# Patient Record
Sex: Male | Born: 2000 | Race: White | Hispanic: No | Marital: Single | Smoking: Never smoker
Health system: Southern US, Community
[De-identification: ages and names within clinical notes are randomized; demographics above are authoritative.]

---

## 2019-07-18 ENCOUNTER — Emergency Department: Payer: Managed Care, Other (non HMO)

## 2019-07-18 ENCOUNTER — Emergency Department
Admission: EM | Admit: 2019-07-18 | Discharge: 2019-07-18 | Disposition: A | Discharge status: Home / Self Care | Payer: Managed Care, Other (non HMO) | Attending: Emergency Medicine | Admitting: Emergency Medicine

## 2019-07-18 ENCOUNTER — Other Ambulatory Visit: Payer: Self-pay

## 2019-07-18 ENCOUNTER — Encounter: Payer: Self-pay | Admitting: Emergency Medicine

## 2019-07-18 DIAGNOSIS — S93401A Sprain of unspecified ligament of right ankle, initial encounter: Secondary | ICD-10-CM | POA: Insufficient documentation

## 2019-07-18 DIAGNOSIS — Y999 Unspecified external cause status: Secondary | ICD-10-CM | POA: Insufficient documentation

## 2019-07-18 DIAGNOSIS — Y9289 Other specified places as the place of occurrence of the external cause: Secondary | ICD-10-CM | POA: Diagnosis not present

## 2019-07-18 DIAGNOSIS — Y9389 Activity, other specified: Secondary | ICD-10-CM | POA: Diagnosis not present

## 2019-07-18 DIAGNOSIS — S99911A Unspecified injury of right ankle, initial encounter: Secondary | ICD-10-CM | POA: Diagnosis present

## 2019-07-18 DIAGNOSIS — X509XXA Other and unspecified overexertion or strenuous movements or postures, initial encounter: Secondary | ICD-10-CM | POA: Diagnosis not present

## 2019-07-18 MED ORDER — IBUPROFEN 800 MG PO TABS
800.0000 mg | ORAL_TABLET | Freq: Three times a day (TID) | ORAL | 0 refills | Status: AC | PRN
Start: 2019-07-18 — End: ?

## 2019-07-18 MED ORDER — IBUPROFEN 800 MG PO TABS
800.0000 mg | ORAL_TABLET | Freq: Once | ORAL | Status: AC
  Administered 2019-07-18: 800 mg via ORAL
  Filled 2019-07-18: qty 1

## 2019-07-18 NOTE — ED Provider Notes (Signed)
[facility] Emergency Department Provider Note  ____________________________________________   First MD Initiated Contact with Patient 07/18/19 [phone]     (approximate)  I have reviewed the triage vital signs and the nursing notes.   HISTORY  Chief Complaint Ankle Pain    HPI Roverto Bodmer is a 19 y.o. male resents to the emergency department secondary to right ankle pain after "rolling his ankle while he was going down steps approximately 1 AM this morning.  Patient states that pain is mild at present.  Pain is worsened with movement of the ankle.        History reviewed. No pertinent past medical history.  There are no problems to display for this patient.   History reviewed. No pertinent surgical history.  Prior to Admission medications   Not on File    Allergies Patient has no known allergies.  No family history on file.  Social History Social History   Tobacco Use  . Smoking status: Never Smoker  . Smokeless tobacco: Never Used  Substance Use Topics  . Alcohol use: Not on file  . Drug use: Not on file    Review of Systems Constitutional: No fever/chills Eyes: No visual changes. ENT: No sore throat. Cardiovascular: Denies chest pain. Respiratory: Denies shortness of breath. Gastrointestinal: No abdominal pain.  No nausea, no vomiting.  No diarrhea.  No constipation. Genitourinary: Negative for dysuria. Musculoskeletal: Negative for neck pain.  Negative for back pain. Integumentary: Negative for rash. Neurological: Negative for headaches, focal weakness or numbness. Psychiatric:    ____________________________________________   PHYSICAL EXAM:  VITAL SIGNS: ED Triage Vitals  Enc Vitals Group     BP 07/18/19 0359 131/67     Pulse Rate 07/18/19 0359 84     Resp 07/18/19 0359 18     Temp 07/18/19 0359 98.2 F (36.8 C)     Temp Source 07/18/19 0359 Oral     SpO2 07/18/19 0359 97 %     Weight 07/18/19 0358 68 kg (150  lb)     Height 07/18/19 0358 1.753 m (5\' 9" )     Head Circumference --      Peak Flow --      Pain Score 07/18/19 0358 7     Pain Loc --      Pain Edu? --      Excl. in GC? --     Constitutional: Alert and oriented.  Eyes: Conjunctivae are normal.  Head: Atraumatic. Mouth/Throat: Patient is wearing a mask. Neck: No stridor.  .   Musculoskeletal: Lateral malleoli swelling and pain with gentle palpation.  Mild pain with active and passive range of motion Neurologic:  Normal speech and language. No gross focal neurologic deficits are appreciated.  Skin:  Skin is warm, dry and intact. Psychiatric: Mood and affect are normal. Speech and behavior are normal.  ____________________________________________   LABS (all labs ordered are listed, but only abnormal results are displayed)  Labs Reviewed - No data to display ____________________________________________  EKG   RADIOLOGY I, [place] N Pleasant Bensinger, personally viewed and evaluated these images (plain radiographs) as part of my medical decision making, as well as reviewing the written report by the radiologist.  ED MD interpretation:    Official radiology report(s): DG Ankle Complete Right  Result Date: 07/18/2019 CLINICAL DATA:  Posttraumatic right ankle pain EXAM: RIGHT ANKLE - COMPLETE 3+ VIEW COMPARISON:  None. FINDINGS: Soft tissue swelling and probable ankle joint effusion. No acute fracture or subluxation. IMPRESSION: Soft tissue  swelling and probable ankle joint effusion. No acute fracture or subluxation. Electronically Signed   By: Monte Fantasia M.D.   On: 07/18/2019 04:41    ____________________________________________   Procedures   ____________________________________________   INITIAL IMPRESSION / MDM / [place]fr[place] / ED COURSE  As part of my medical decision making, I reviewed the following data within the electronic MEDICAL RECORD NUMBER  19 year old male presented with above-stated history and physical  exam secondary to right ankle injury with pressure diagnosis including but not limited to sprain ligamentous tear, fracture.  X-ray revealed no acute fracture or subluxation however did reveal soft tissue swelling and probably joint effusion.  Boot was applied to the patient crutches were given.  Patient given ibuprofen 800 mg.  Spoke with the patient at length regarding sprain management at home.     ____________________________________________  FINAL CLINICAL IMPRESSION(S) / ED DIAGNOSES  Final diagnoses:  None     MEDICATIONS GIVEN DURING THIS VISIT:  Medications - No data to display   ED Discharge Orders    None      *Please note:  Johnpatrick Jenny was evaluated in Emergency Department on 07/18/2019 for the symptoms described in the history of present illness. He was evaluated in the context of the global COVID-19 pandemic, which necessitated consideration that the patient might be at risk for infection with the SARS-CoV-2 virus that causes COVID-19. Institutional protocols and algorithms that pertain to the evaluation of patients at risk for COVID-19 are in a state of rapid change based on information released by regulatory bodies including the CDC and federal and state organizations. These policies and algorithms were followed during the patient's care in the ED.  Some ED evaluations and interventions may be delayed as a result of limited staffing during the pandemic.*  Note:  This document was prepared using Dragon voice recognition software and may include unintentional dictation errors.   Gregor Hams, MD 07/18/19 [phone]

## 2019-07-18 NOTE — ED Triage Notes (Signed)
Pt to triage via w/c with no distress noted, mask in place; pt reports "rolling" right ankle going down steps several hrs ago

## 2021-12-20 IMAGING — CR DG ANKLE COMPLETE 3+V*R*
1 series · 3 of 3 positions shown · non-contrast
Comparison: None.

CLINICAL DATA: Posttraumatic right ankle pain

EXAM:
RIGHT ANKLE - COMPLETE 3+ VIEW

[Series 1: x ankle ap right · 0.14mm/px · 3 of 3 slices shown]
[im 1/3]
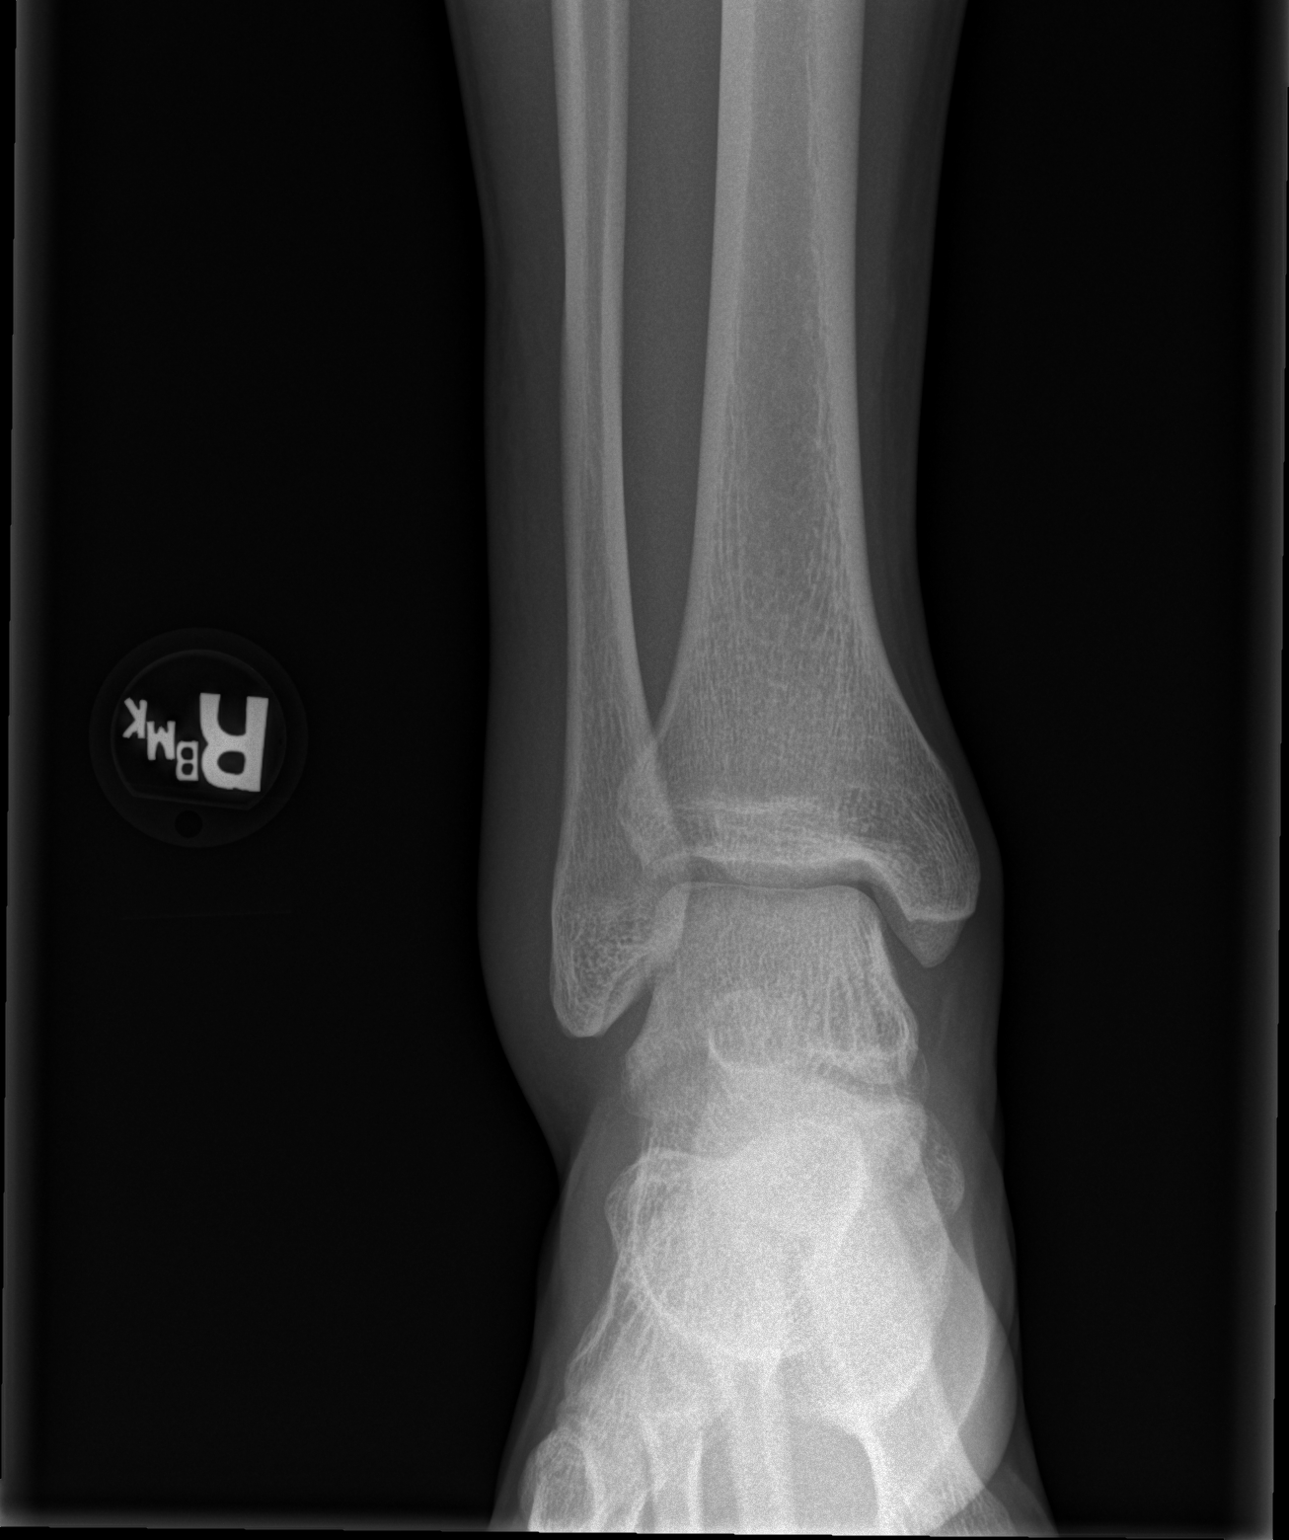
[im 2/3]
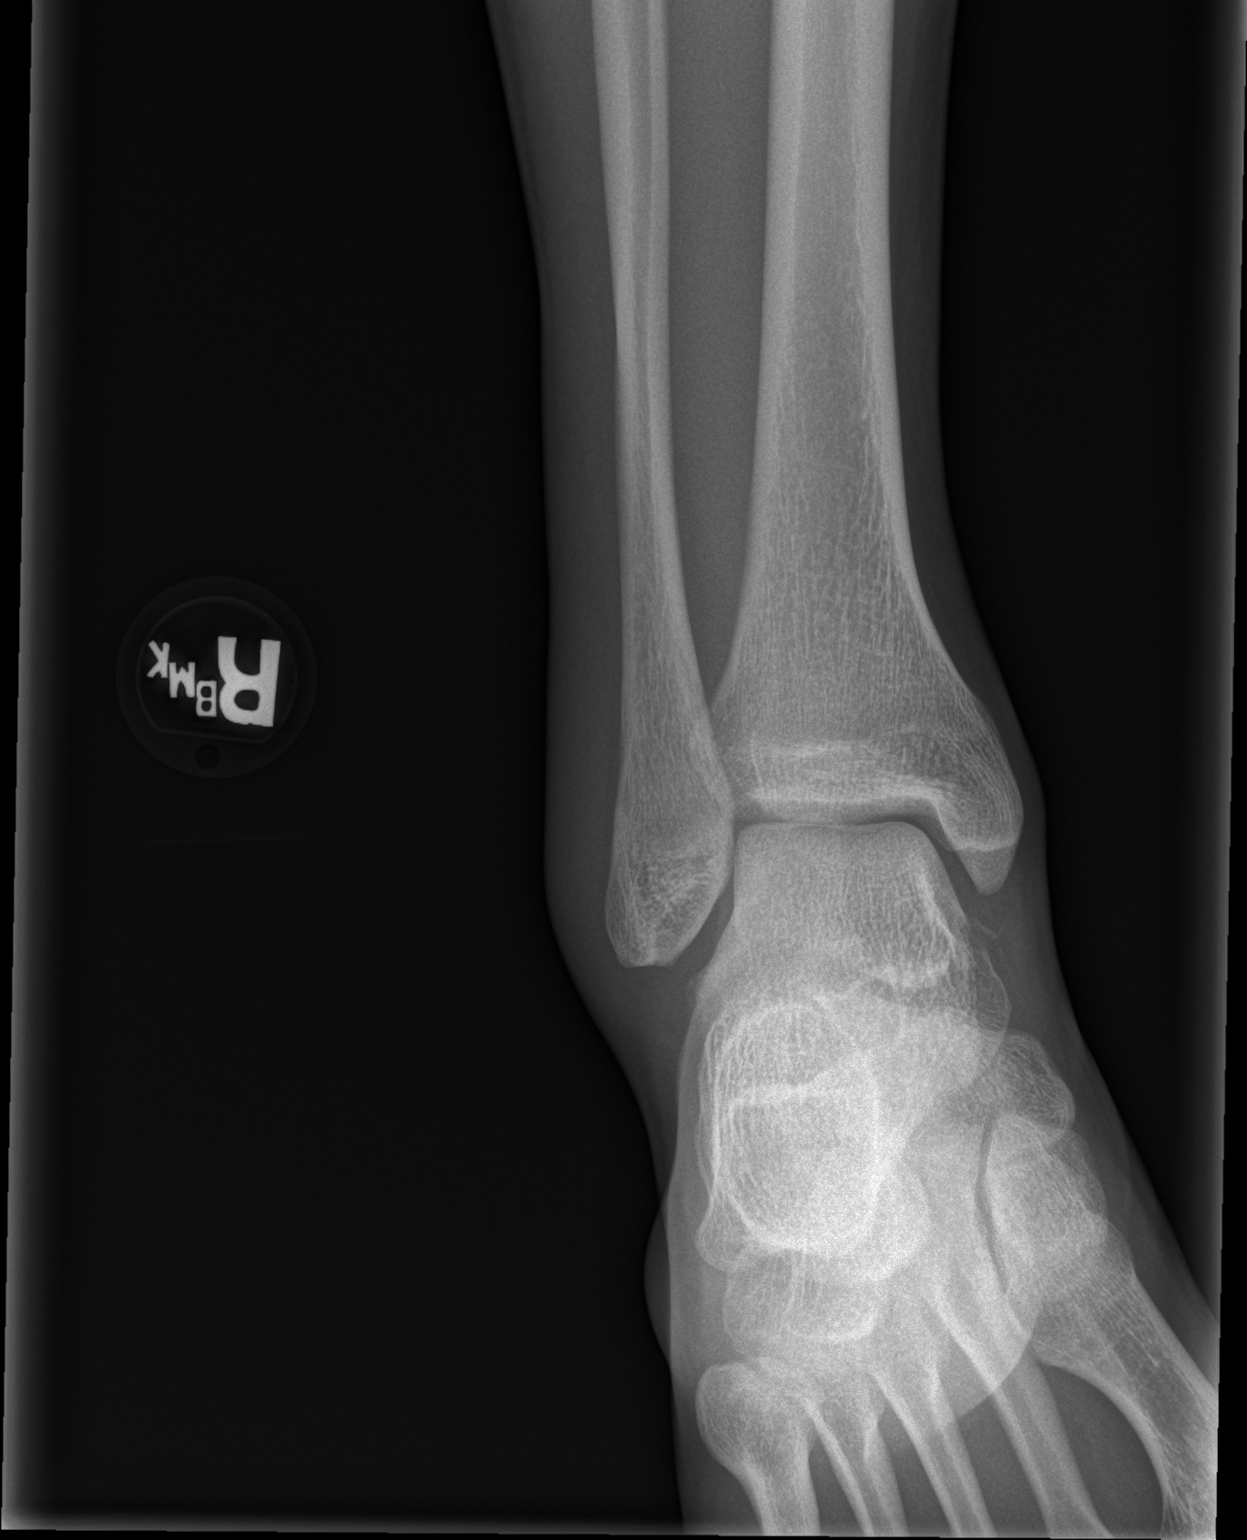
[im 3/3]
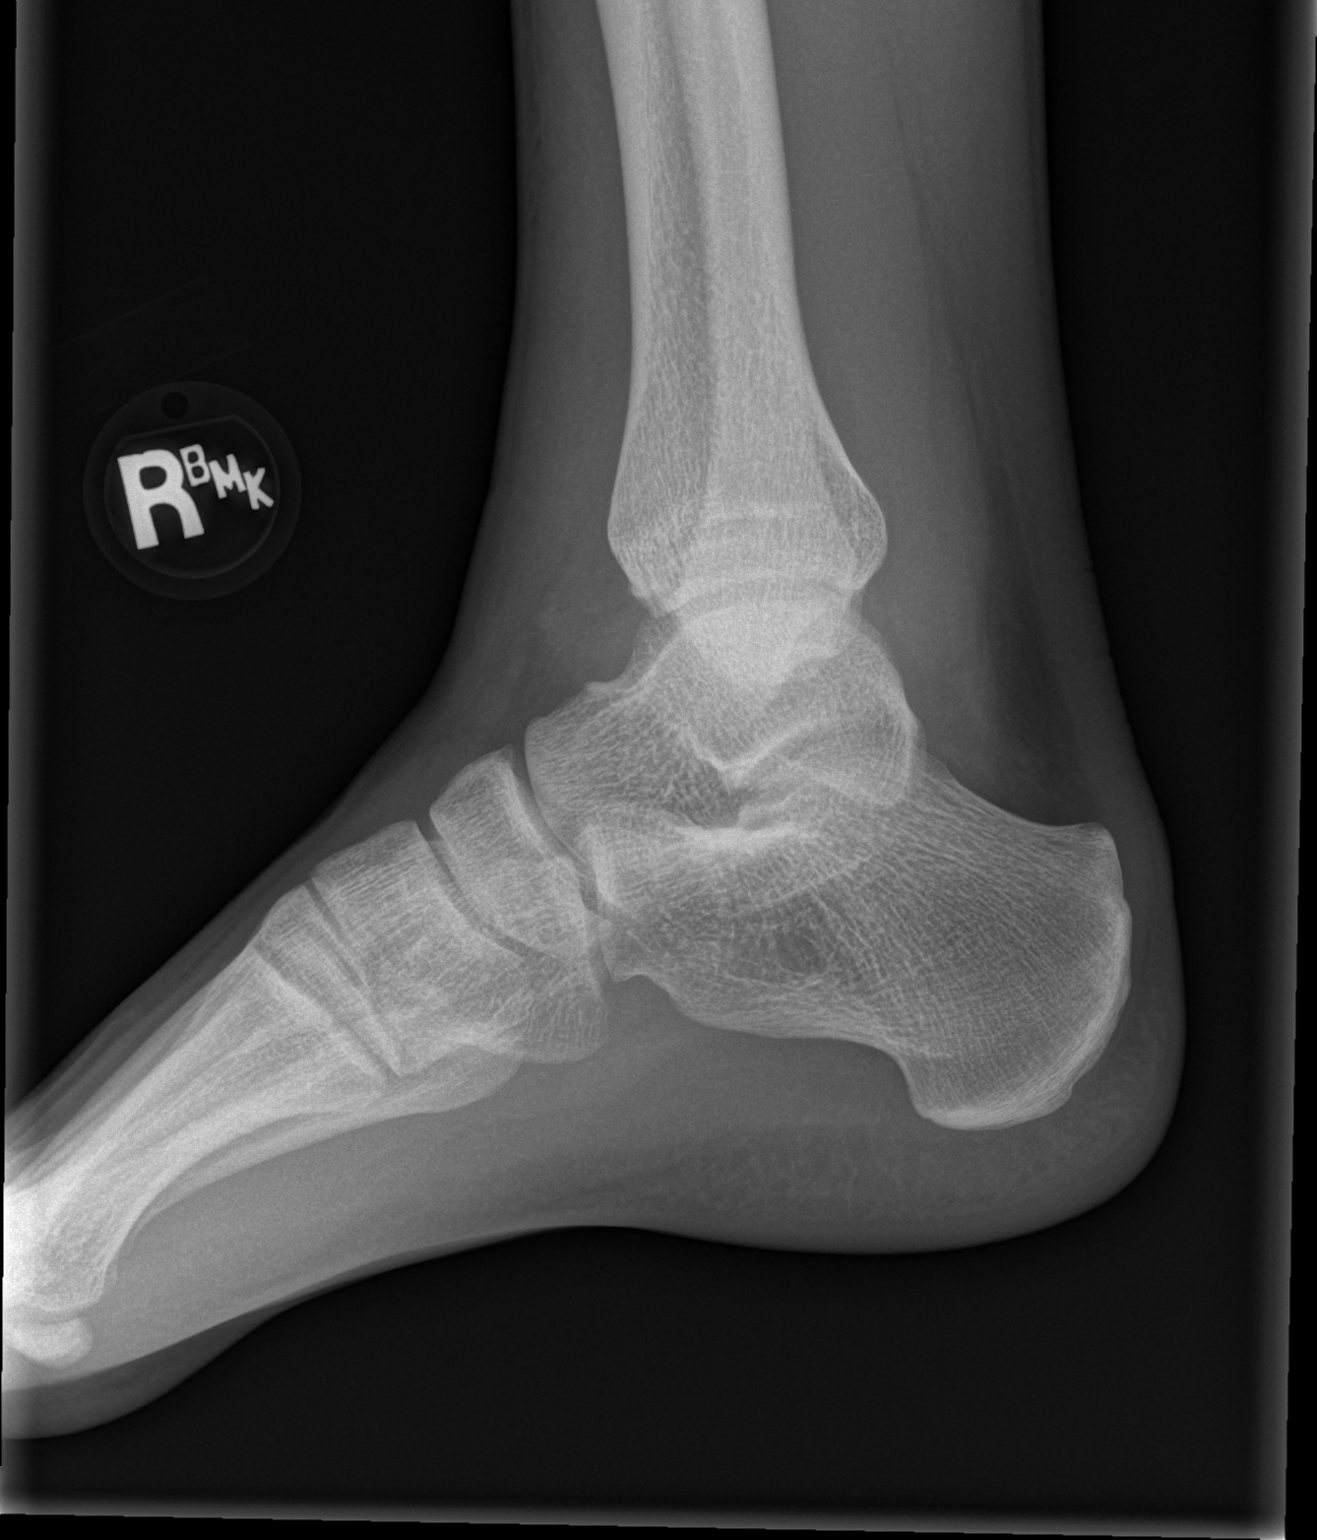

[3 of 3 positions shown; findings below may reference images not displayed]

FINDINGS: Soft tissue swelling and probable ankle joint effusion. No acute
fracture or subluxation.
IMPRESSION: Soft tissue swelling and probable ankle joint effusion. No acute
fracture or subluxation.
